# Patient Record
Sex: Male | Born: 1975 | Race: White | Hispanic: No | Marital: Married
Health system: Midwestern US, Academic
[De-identification: ages and names within clinical notes are randomized; demographics above are authoritative.]

---

## 2017-07-31 ENCOUNTER — Encounter: Admit: 2017-07-31 | Discharge: 2017-07-31 | Payer: Commercial Managed Care - HMO

## 2017-07-31 ENCOUNTER — Encounter: Admit: 2017-07-31 | Discharge: 2017-07-31 | Payer: BC Managed Care – PPO

## 2017-07-31 ENCOUNTER — Inpatient Hospital Stay: Admit: 2017-07-31 | Discharge: 2017-07-31 | Payer: BC Managed Care – PPO

## 2017-07-31 DIAGNOSIS — S020XXA Fracture of vault of skull, initial encounter for closed fracture: Principal | ICD-10-CM

## 2017-07-31 DIAGNOSIS — S0219XA Other fracture of base of skull, initial encounter for closed fracture: Principal | ICD-10-CM

## 2017-07-31 DIAGNOSIS — R69 Illness, unspecified: ICD-10-CM

## 2017-07-31 DIAGNOSIS — R569 Unspecified convulsions: ICD-10-CM

## 2017-07-31 DIAGNOSIS — R561 Post traumatic seizures: ICD-10-CM

## 2017-07-31 DIAGNOSIS — S060X1A Concussion with loss of consciousness of 30 minutes or less, initial encounter: Secondary | ICD-10-CM

## 2017-07-31 MED ORDER — POTASSIUM CHLORIDE IN WATER 10 MEQ/50 ML IV PGBK
10 meq | INTRAVENOUS | 0 refills | Status: CP
Start: 2017-07-31 — End: ?
  Administered 2017-08-01 (×5): 10 meq via INTRAVENOUS

## 2017-07-31 MED ORDER — FENTANYL CITRATE (PF) 50 MCG/ML IJ SOLN
25-50 ug | INTRAVENOUS | 0 refills | Status: DC | PRN
Start: 2017-07-31 — End: 2017-08-01

## 2017-07-31 MED ORDER — BENZOCAINE-MENTHOL 6-10 MG MM LOZG
1 | ORAL | 0 refills | Status: DC | PRN
Start: 2017-07-31 — End: 2017-08-02

## 2017-07-31 MED ORDER — LEVETIRACETAM IN NACL (ISO-OS) 1,500 MG/100 ML IV PGBK
1500 mg | Freq: Once | INTRAVENOUS | 0 refills | Status: CP
Start: 2017-07-31 — End: ?
  Administered 2017-08-01: 05:00:00 1500 mg via INTRAVENOUS

## 2017-07-31 MED ORDER — ONDANSETRON HCL (PF) 4 MG/2 ML IJ SOLN
4-8 mg | INTRAVENOUS | 0 refills | Status: DC | PRN
Start: 2017-07-31 — End: 2017-08-02

## 2017-08-01 ENCOUNTER — Encounter: Admit: 2017-08-01 | Discharge: 2017-08-01 | Payer: BC Managed Care – PPO

## 2017-08-01 ENCOUNTER — Inpatient Hospital Stay: Admit: 2017-08-01 | Discharge: 2017-08-01 | Payer: Commercial Managed Care - HMO

## 2017-08-01 ENCOUNTER — Inpatient Hospital Stay: Admit: 2017-08-01 | Discharge: 2017-08-01 | Payer: BC Managed Care – PPO

## 2017-08-01 ENCOUNTER — Inpatient Hospital Stay
Admit: 2017-08-01 | Discharge: 2017-08-01 | Disposition: A | Discharge status: Home / Self Care | Payer: BC Managed Care – PPO | Source: Other Acute Inpatient Hospital

## 2017-08-01 DIAGNOSIS — I10 Essential (primary) hypertension: ICD-10-CM

## 2017-08-01 DIAGNOSIS — G8911 Acute pain due to trauma: ICD-10-CM

## 2017-08-01 DIAGNOSIS — E871 Hypo-osmolality and hyponatremia: ICD-10-CM

## 2017-08-01 DIAGNOSIS — S0219XA Other fracture of base of skull, initial encounter for closed fracture: Principal | ICD-10-CM

## 2017-08-01 DIAGNOSIS — R569 Unspecified convulsions: ICD-10-CM

## 2017-08-01 DIAGNOSIS — S060X1A Concussion with loss of consciousness of 30 minutes or less, initial encounter: ICD-10-CM

## 2017-08-01 DIAGNOSIS — S020XXA Fracture of vault of skull, initial encounter for closed fracture: Principal | ICD-10-CM

## 2017-08-01 LAB — COMPREHENSIVE METABOLIC PANEL
Lab: 0.6 mg/dL (ref 0.4–1.24)
Lab: 0.9 mg/dL — ABNORMAL LOW (ref 0.3–1.2)
Lab: 132 MMOL/L — ABNORMAL LOW (ref 137–147)
Lab: 16 U/L — ABNORMAL LOW (ref 7–56)
Lab: 29 U/L (ref 7–40)
Lab: 3.4 MMOL/L — ABNORMAL LOW (ref 3.5–5.1)
Lab: 30 MMOL/L — ABNORMAL HIGH (ref 21–30)
Lab: 4.5 g/dL (ref 3.5–5.0)
Lab: 6.8 g/dL — ABNORMAL HIGH (ref 6.0–8.0)
Lab: 7 mg/dL (ref 7–25)
Lab: 77 U/L (ref 25–110)
Lab: 9 10*3/uL (ref 3–12)
Lab: 9.3 mg/dL (ref 8.5–10.6)
Lab: 93 MMOL/L — ABNORMAL LOW (ref 98–110)
Lab: 96 mg/dL (ref 70–100)
Lab: >60 mL/min (ref >60)
Lab: >60 mL/min (ref >60)
Lab: 131 MMOL/L — ABNORMAL LOW (ref 137–147)

## 2017-08-01 LAB — PROTIME INR (PT): Lab: 0.9 g/dL (ref 0.8–1.2)

## 2017-08-01 LAB — PTT (APTT): Lab: 22 s — ABNORMAL LOW (ref 24.0–36.5)

## 2017-08-01 LAB — CBC AND DIFF
Lab: 10 10*3/uL (ref 4.5–11.0)
Lab: 9.6 K/UL — ABNORMAL LOW (ref 4.5–11.0)

## 2017-08-01 MED ORDER — LEVETIRACETAM 500 MG PO TAB
500 mg | Freq: Two times a day (BID) | ORAL | 0 refills | Status: DC
Start: 2017-08-01 — End: 2017-08-02

## 2017-08-01 MED ORDER — POLYETHYLENE GLYCOL 3350 17 GRAM PO PWPK
1 | Freq: Every day | ORAL | 0 refills | Status: DC
Start: 2017-08-01 — End: 2017-08-02

## 2017-08-01 MED ORDER — OXYCODONE 5 MG PO TAB
5-10 mg | ORAL_TABLET | ORAL | 0 refills | 6.00000 days | Status: AC | PRN
Start: 2017-08-01 — End: 2019-08-02

## 2017-08-01 MED ORDER — GADOBENATE DIMEGLUMINE 529 MG/ML (0.1MMOL/0.2ML) IV SOLN
13 mL | Freq: Once | INTRAVENOUS | 0 refills | Status: CP
Start: 2017-08-01 — End: ?
  Administered 2017-08-01: 09:00:00 13 mL via INTRAVENOUS

## 2017-08-01 MED ORDER — ACETAMINOPHEN 325 MG PO TAB
650 mg | ORAL | 0 refills | Status: AC
Start: 2017-08-01 — End: 2019-08-02

## 2017-08-01 MED ORDER — LEVETIRACETAM 500 MG PO TAB
500 mg | ORAL_TABLET | Freq: Two times a day (BID) | ORAL | 0 refills | 90.00000 days | Status: AC
Start: 2017-08-01 — End: ?

## 2017-08-01 MED ORDER — ACETAMINOPHEN 325 MG PO TAB
650 mg | ORAL | 0 refills | Status: DC
Start: 2017-08-01 — End: 2017-08-02
  Administered 2017-08-01: 15:00:00 650 mg via ORAL

## 2017-08-01 MED ORDER — ENOXAPARIN 30 MG/0.3 ML SC SYRG
30 mg | Freq: Two times a day (BID) | SUBCUTANEOUS | 0 refills | Status: DC
Start: 2017-08-01 — End: 2017-08-02
  Administered 2017-08-01: 15:00:00 30 mg via SUBCUTANEOUS

## 2017-08-01 MED ORDER — SENNOSIDES 8.6 MG PO TAB
1 | Freq: Two times a day (BID) | ORAL | 0 refills | Status: DC
Start: 2017-08-01 — End: 2017-08-02

## 2017-08-01 MED ORDER — SENNOSIDES 8.6 MG PO TAB
1 | ORAL_TABLET | Freq: Two times a day (BID) | ORAL | 3 refills | Status: AC
Start: 2017-08-01 — End: 2019-08-02

## 2017-08-01 MED ORDER — IOHEXOL 350 MG IODINE/ML IV SOLN
80 mL | Freq: Once | INTRAVENOUS | 0 refills | Status: CP
Start: 2017-08-01 — End: ?
  Administered 2017-08-01: 06:00:00 80 mL via INTRAVENOUS

## 2017-08-01 MED ORDER — SODIUM CHLORIDE 0.9 % IJ SOLN
50 mL | Freq: Once | INTRAVENOUS | 0 refills | Status: CP
Start: 2017-08-01 — End: ?
  Administered 2017-08-01: 06:00:00 50 mL via INTRAVENOUS

## 2017-08-01 MED ORDER — OXYCODONE 5 MG PO TAB
5-10 mg | ORAL | 0 refills | Status: DC | PRN
Start: 2017-08-01 — End: 2017-08-02

## 2017-08-01 MED ORDER — POLYETHYLENE GLYCOL 3350 17 GRAM PO PWPK
17 g | Freq: Every day | ORAL | 0 refills | 18.00000 days | Status: AC
Start: 2017-08-01 — End: 2019-08-02

## 2017-08-03 ENCOUNTER — Encounter: Admit: 2017-08-03 | Discharge: 2017-08-03 | Payer: BC Managed Care – PPO

## 2017-08-17 ENCOUNTER — Ambulatory Visit: Admit: 2017-08-17 | Discharge: 2017-08-18 | Payer: BC Managed Care – PPO

## 2017-08-17 ENCOUNTER — Encounter: Admit: 2017-08-17 | Discharge: 2017-08-17 | Payer: Commercial Managed Care - HMO

## 2017-08-17 DIAGNOSIS — F1010 Alcohol abuse, uncomplicated: ICD-10-CM

## 2017-08-17 DIAGNOSIS — S069X9D Unspecified intracranial injury with loss of consciousness of unspecified duration, subsequent encounter: ICD-10-CM

## 2017-08-17 DIAGNOSIS — R569 Unspecified convulsions: ICD-10-CM

## 2017-08-17 DIAGNOSIS — S020XXD Fracture of vault of skull, subsequent encounter for fracture with routine healing: ICD-10-CM

## 2017-08-17 DIAGNOSIS — W19XXXD Unspecified fall, subsequent encounter: Principal | ICD-10-CM

## 2017-08-17 DIAGNOSIS — S020XXA Fracture of vault of skull, initial encounter for closed fracture: Principal | ICD-10-CM

## 2017-08-17 DIAGNOSIS — R41840 Attention and concentration deficit: ICD-10-CM

## 2017-08-20 ENCOUNTER — Encounter: Admit: 2017-08-20 | Discharge: 2017-08-20 | Payer: Commercial Managed Care - HMO

## 2017-08-20 DIAGNOSIS — R569 Unspecified convulsions: Principal | ICD-10-CM

## 2017-08-20 DIAGNOSIS — S0219XA Other fracture of base of skull, initial encounter for closed fracture: ICD-10-CM

## 2017-11-29 ENCOUNTER — Encounter: Admit: 2017-11-29 | Discharge: 2017-11-29 | Payer: BC Managed Care – PPO

## 2018-01-10 ENCOUNTER — Encounter: Admit: 2018-01-10 | Discharge: 2018-01-10 | Payer: BC Managed Care – PPO

## 2018-05-15 ENCOUNTER — Encounter: Admit: 2018-05-15 | Discharge: 2018-05-15 | Payer: BC Managed Care – PPO

## 2018-12-03 ENCOUNTER — Encounter: Admit: 2018-12-03 | Discharge: 2018-12-03

## 2018-12-03 NOTE — Telephone Encounter
Attempted to call patient for previsit planning. lvm requesting return call    Initial Visit Date: 12/05/18     Reason for Visit:seizures     Any recent injuries?    Previous Diagnosis of Epilepsy?     If Yes? Then by who and at what age?      Physician Information  PCP:   Referring Physician: Helayne Seminole, MD  Previous Neurologist:    Other providers seen:      Previous Imaging/Procedures: CT head     Recent ED/Hospitalizations:      Current Meds:     Current Allergies:     Past Tried and Failed AED:    Reason for Discontinuing any AED's:     Epilepsy Education Provided: (List the education that we provide over the phone)   Any social concerns or social work concerns that need to be addressed at appointment with physician

## 2018-12-05 ENCOUNTER — Encounter: Admit: 2018-12-05 | Discharge: 2018-12-05

## 2018-12-05 ENCOUNTER — Ambulatory Visit: Admit: 2018-12-05 | Discharge: 2018-12-06

## 2018-12-05 ENCOUNTER — Ambulatory Visit: Admit: 2018-12-05 | Discharge: 2018-12-05

## 2018-12-05 DIAGNOSIS — G40109 Localization-related (focal) (partial) symptomatic epilepsy and epileptic syndromes with simple partial seizures, not intractable, without status epilepticus: Secondary | ICD-10-CM

## 2018-12-05 DIAGNOSIS — R569 Unspecified convulsions: Secondary | ICD-10-CM

## 2018-12-05 DIAGNOSIS — S0219XA Other fracture of base of skull, initial encounter for closed fracture: Secondary | ICD-10-CM

## 2018-12-05 MED ORDER — ZONISAMIDE 100 MG PO CAP
ORAL_CAPSULE | Freq: Every evening | ORAL | 5 refills | 90.00000 days | Status: DC
Start: 2018-12-05 — End: 2019-04-22

## 2018-12-05 NOTE — Progress Notes
Patient was scheduled for Video EEG Sunday, September 20th at 11 AM.   MRI 3T scheduled for 9/1    VEEG phamphlet provided. All questions answered.
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
## 2018-12-06 ENCOUNTER — Encounter: Admit: 2018-12-06 | Discharge: 2018-12-06

## 2018-12-06 DIAGNOSIS — R569 Unspecified convulsions: Secondary | ICD-10-CM

## 2018-12-06 DIAGNOSIS — S0219XA Other fracture of base of skull, initial encounter for closed fracture: Secondary | ICD-10-CM

## 2018-12-17 ENCOUNTER — Encounter: Admit: 2018-12-17 | Discharge: 2018-12-17

## 2018-12-17 NOTE — Telephone Encounter
No improvement would not be surprising as it is such a low dose.     However, the mood issue is important. What is going on in that regard; how has depression worsened? Any suicidal thoughts or other negative intrusive thoughts?     If the mood issue is significant, then we would just stop the medication now. We would then consider another medicine such as oxcarbazepine (after zonisamide out of his system).     If the mood issue is uncertain or mild, etc, we could see how he does on the 200. But, with close monitoring of mood and contacting us if anything worsens.     Melburn Popper, MD

## 2018-12-17 NOTE — Telephone Encounter
Received a call from the patient's wife with update. Wife states that patient's events have not improved in frequency at all since starting zonisamide. Pt currently taking 100 mg qhs, scheduled to increase to 200 mg qhs today. Wife also states that pt's depression has worsened since starting this medication.

## 2018-12-17 NOTE — Telephone Encounter
Spoke with patient/patient's wife regarding medication. Went over recommendations per Dr. Ladell Pier. Wife states that pt is depressed (feels down/negative) but they are comfortable with monitoring for now. Pt will increase to 200 mg and closely watch for any worsening of depression. Wife states she will call with any concerns and is comfortable with the plan moving forward. She states that pt is not having suicidal thoughts.

## 2019-01-07 ENCOUNTER — Ambulatory Visit: Admit: 2019-01-07 | Discharge: 2019-01-07 | Payer: Commercial Managed Care - HMO

## 2019-01-07 DIAGNOSIS — G40109 Localization-related (focal) (partial) symptomatic epilepsy and epileptic syndromes with simple partial seizures, not intractable, without status epilepticus: Secondary | ICD-10-CM
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
## 2019-01-22 ENCOUNTER — Ambulatory Visit: Admit: 2019-01-22 | Discharge: 2019-01-22

## 2019-01-22 ENCOUNTER — Encounter: Admit: 2019-01-22 | Discharge: 2019-01-22

## 2019-01-22 DIAGNOSIS — G40109 Localization-related (focal) (partial) symptomatic epilepsy and epileptic syndromes with simple partial seizures, not intractable, without status epilepticus: Principal | ICD-10-CM

## 2019-01-22 MED ORDER — GADOBENATE DIMEGLUMINE 529 MG/ML (0.1MMOL/0.2ML) IV SOLN
14 mL | Freq: Once | INTRAVENOUS | 0 refills | Status: CP
Start: 2019-01-22 — End: ?
  Administered 2019-01-22: 19:00:00 14 mL via INTRAVENOUS

## 2019-01-25 ENCOUNTER — Encounter: Admit: 2019-01-25 | Discharge: 2019-01-25

## 2019-01-25 NOTE — Telephone Encounter
-----   Message from Melburn Popper, MD sent at 01/23/2019 12:17 PM CDT -----  Alli, please let pt know that MRI brain was stable from his prior one in 2019.     I also ccStanton Kidney in case you are not able to get to this.     Thanks   Melburn Popper, MD

## 2019-01-25 NOTE — Telephone Encounter
Spoke with pt wife and discussed results of MRI per Dr. Ladell Pier note. Wife verbalzied understanding

## 2019-01-30 NOTE — Telephone Encounter
Wife called and wants to know where they go from here. I will be glad to call her back if you know what Dr Leslie Andrea plan is. Thanks.

## 2019-02-04 ENCOUNTER — Encounter: Admit: 2019-02-04 | Discharge: 2019-02-04 | Payer: Commercial Managed Care - HMO

## 2019-02-04 NOTE — Telephone Encounter
LVM requesting return call to confirm VEEG admission. Admission approved by insurance

## 2019-02-16 IMAGING — CT Spine^1_C_SPINE (Adult)
1 series · 12 of 14 positions shown, 15 images · non-contrast
Comparison: none

[Series 3: c-spine 1.5 soft tissue · axial · 0.34mm/px · z∈[+81,+232]mm · 12 of 121 slices shown, 15 images]
[im 10/121  soft-tissue]
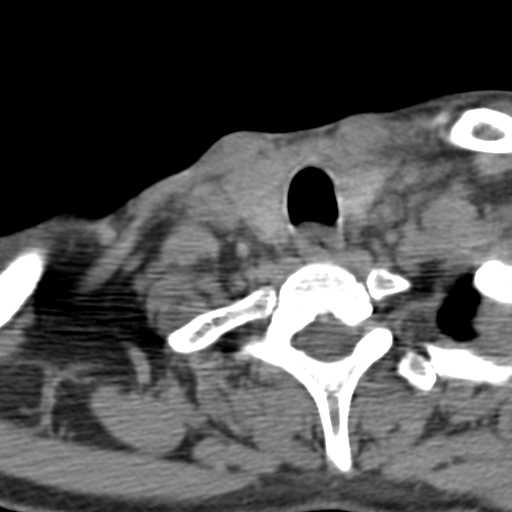
[im 10/121  bone]
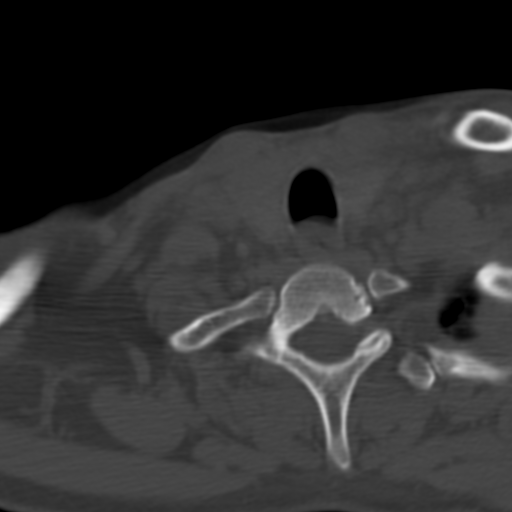
[im 19/121  bone]
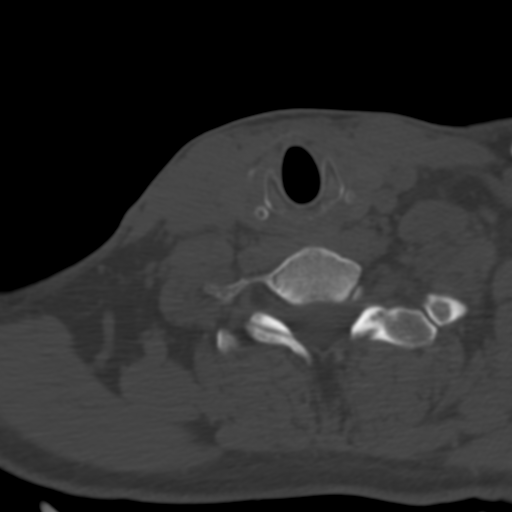
[im 28/121  bone]
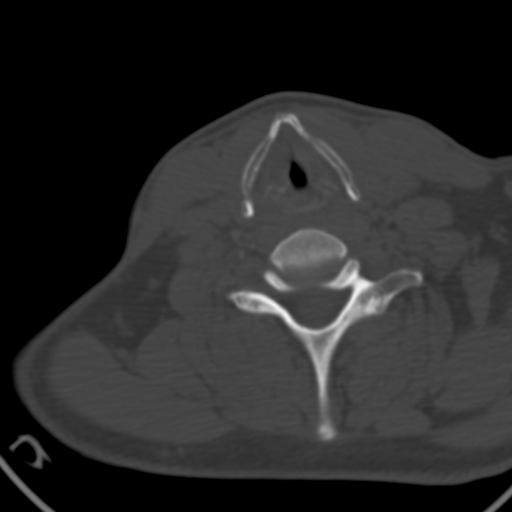
[im 37/121  bone]
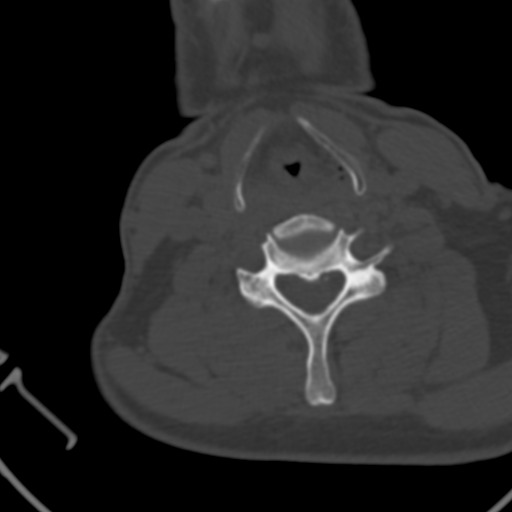
[im 47/121  soft-tissue]
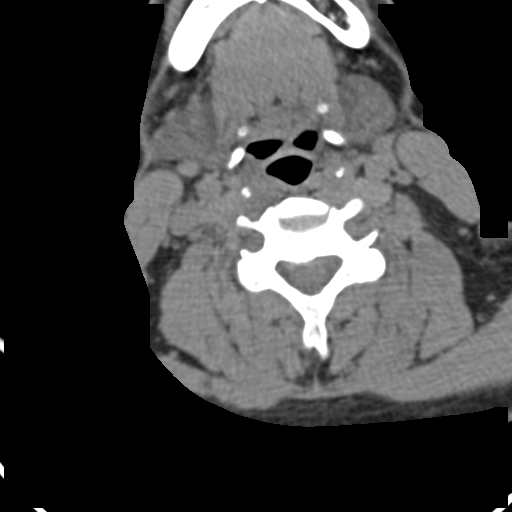
[im 47/121  bone]
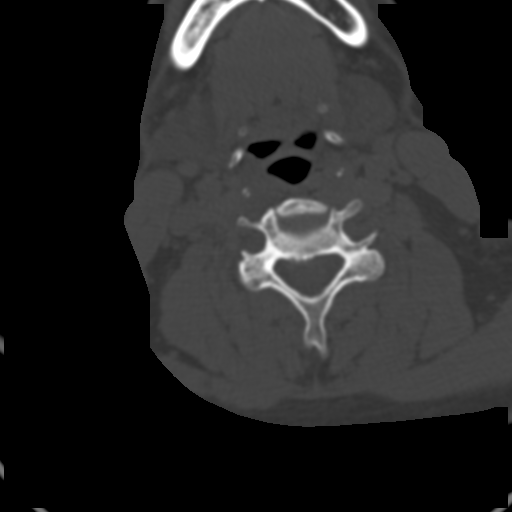
[im 56/121  bone]
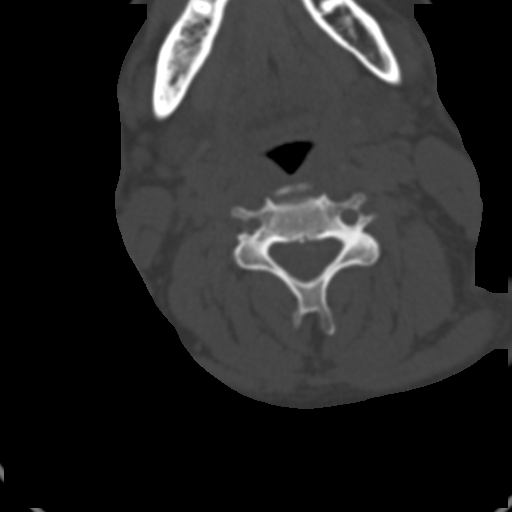
[im 65/121  bone]
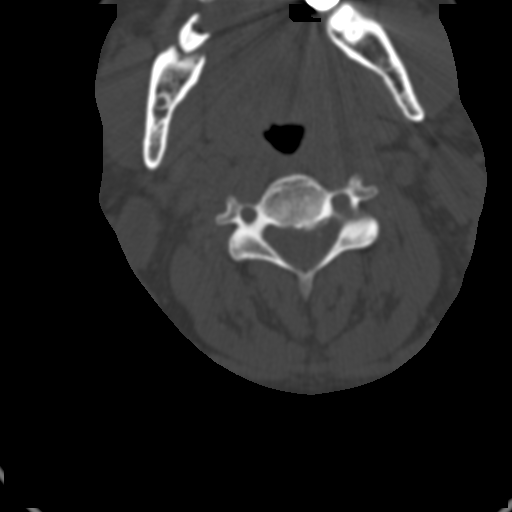
[im 74/121  bone]
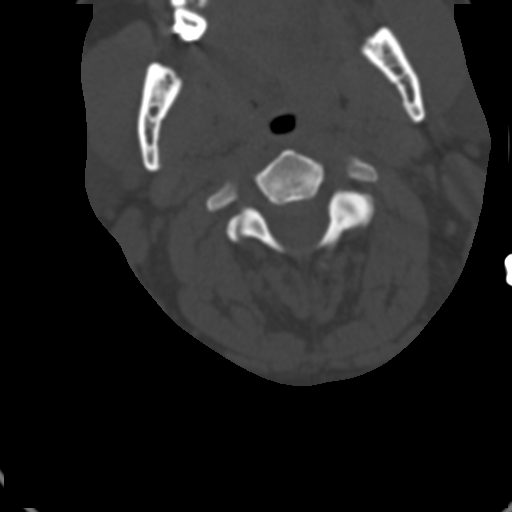
[im 84/121  soft-tissue]
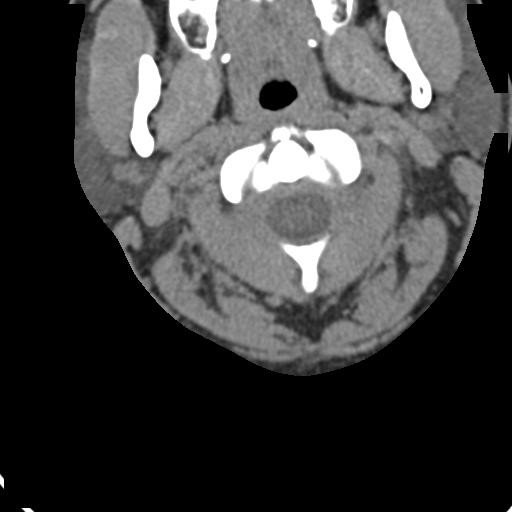
[im 84/121  bone]
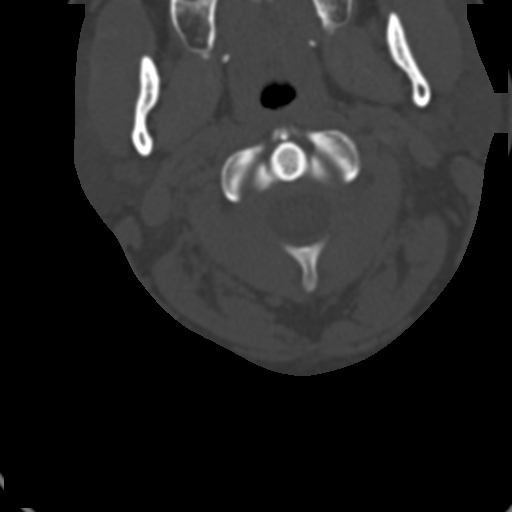
[im 93/121  bone]
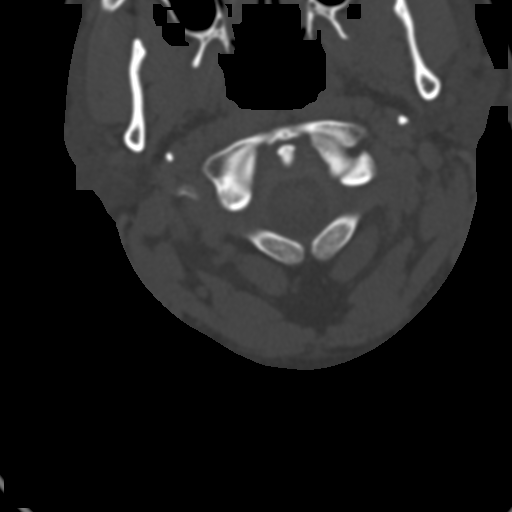
[im 102/121  bone]
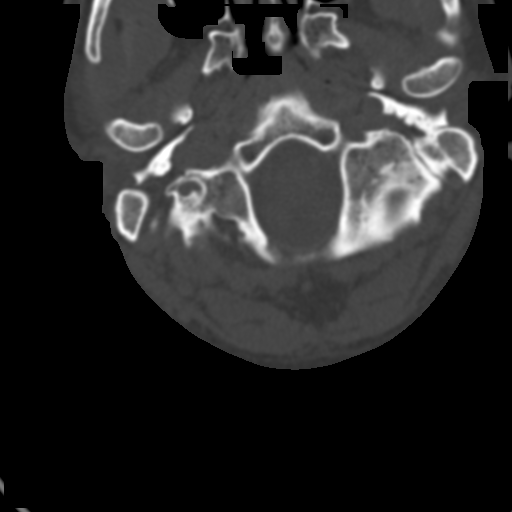
[im 111/121  bone]
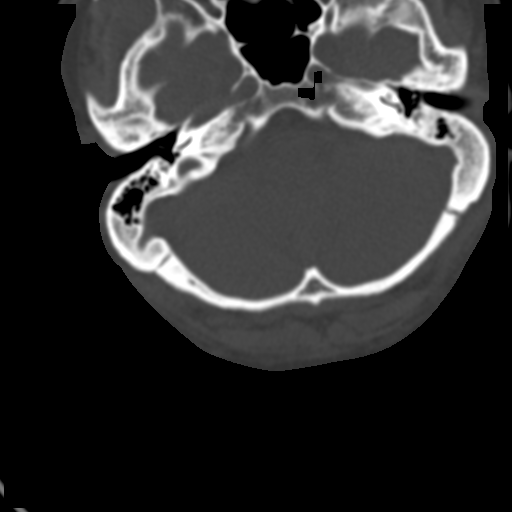

[12 of 14 positions shown; findings below may reference images not displayed]

EXAM

CT cervical spine without contrast

INDICATION

injury
NEW ONSET SEIZURE. RIGHT SIDE TEMPORAL TRAUMA. TJ

TECHNIQUE

Volumetric multi detector CT images of the cervical spine were obtained without the administration
of IV contrast.

All CT scans at this facility use dose modulation, iterative reconstruction, and/or weight based
dosing when appropriate to reduce radiation dose to as low as reasonably achievable.

COMPARISONS

None available.

FINDINGS

The cervical vertebral body heights are grossly maintained with mild anterolisthesis of C2 on C3.
There is moderate multilevel degenerative disc disease with marginal osteophyte formation and
spinal canal narrowing. The facets are grossly well aligned. There is no significant
spondylolisthesis. There is no large, displaced fracture. Midline posterior fusion anomaly of the
C1 arch is appreciated. The lung apices are grossly clear. The odontoid process is grossly intact.

IMPRESSION

1.  Mild to moderate degenerative changes of the cervical spine without acute osseous abnormality.

## 2019-02-16 IMAGING — CT Head^_WITHOUT_CONTRAST (Adult)
1 series · 15 of 30 positions shown, 19 images · non-contrast
Comparison: none

[Series 2: brain w/o 4.8 brain · axial · non-contrast · 0.55mm/px · z∈[+143,+302]mm · 15 of 36 slices shown, 19 images]
[im 2/36  brain]
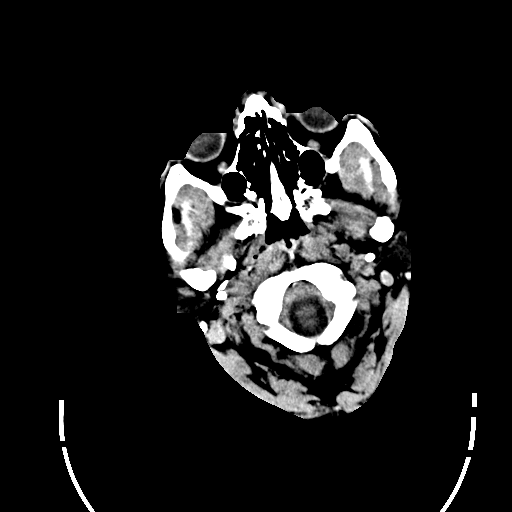
[im 2/36  bone]
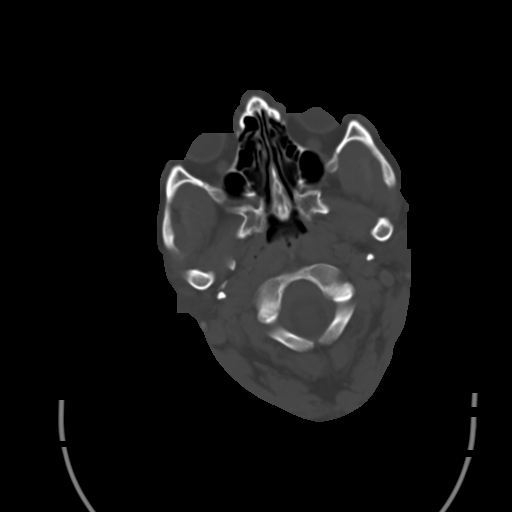
[im 4/36  brain]
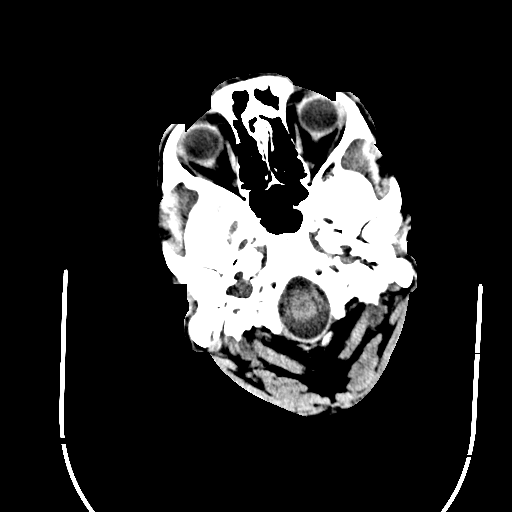
[im 7/36  brain]
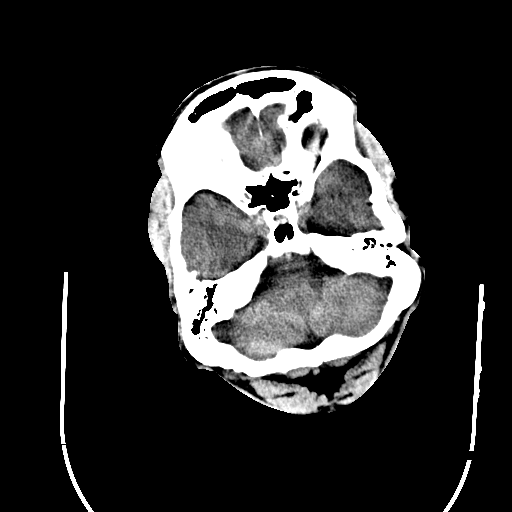
[im 9/36  brain]
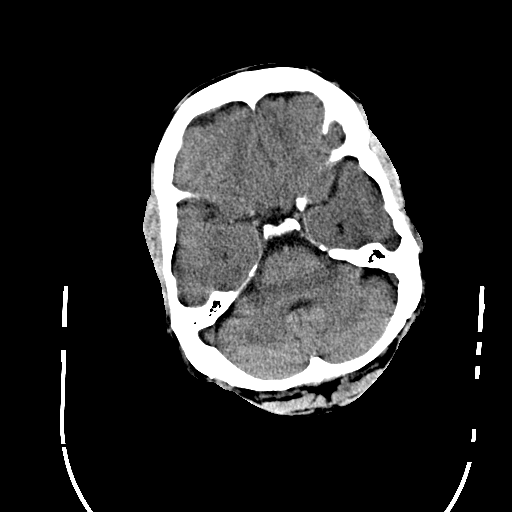
[im 11/36  brain]
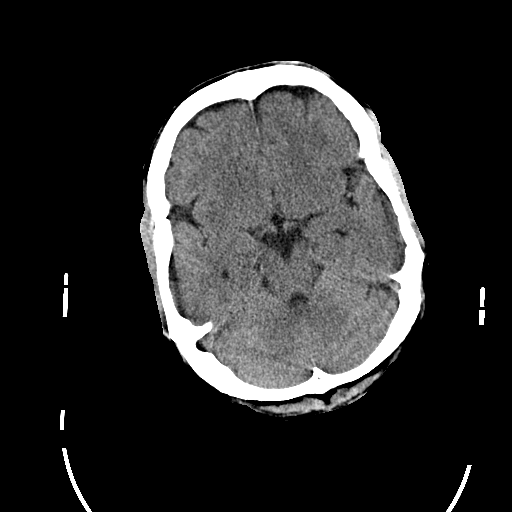
[im 11/36  bone]
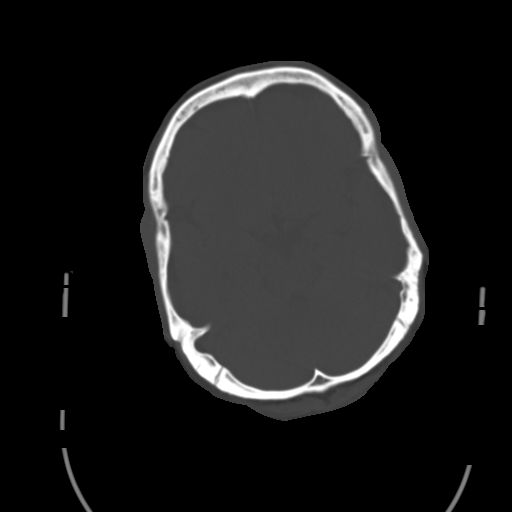
[im 14/36  brain]
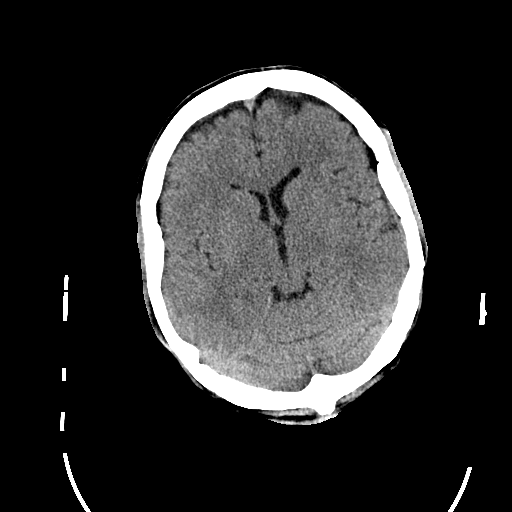
[im 16/36  brain]
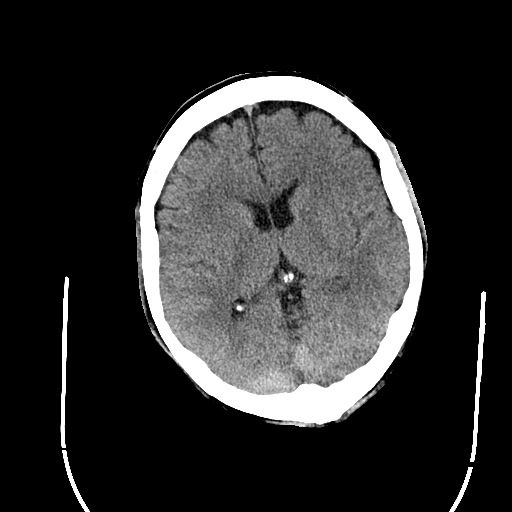
[im 19/36  brain]
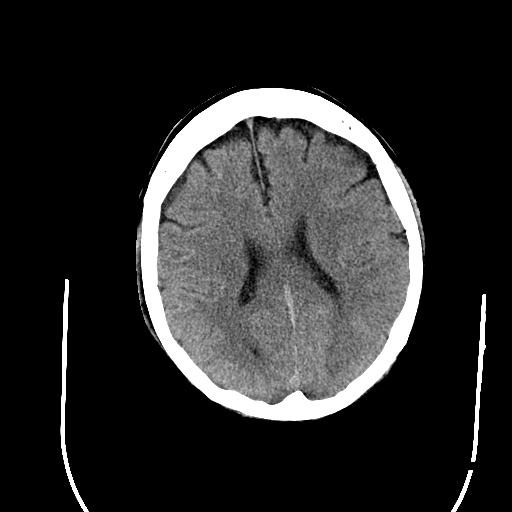
[im 20/36  brain]
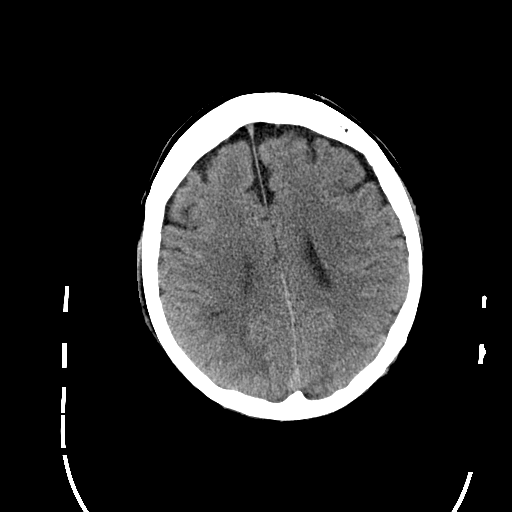
[im 20/36  bone]
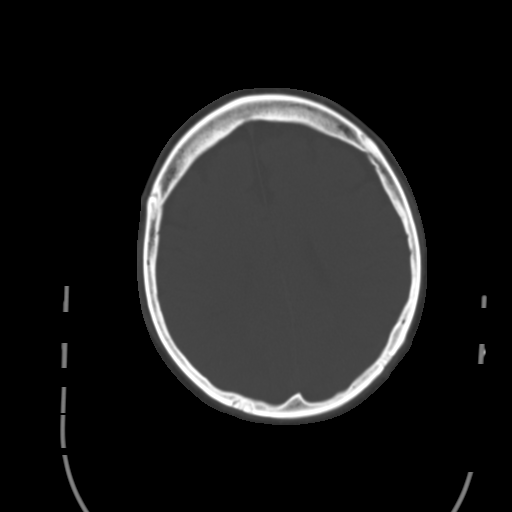
[im 22/36  brain]
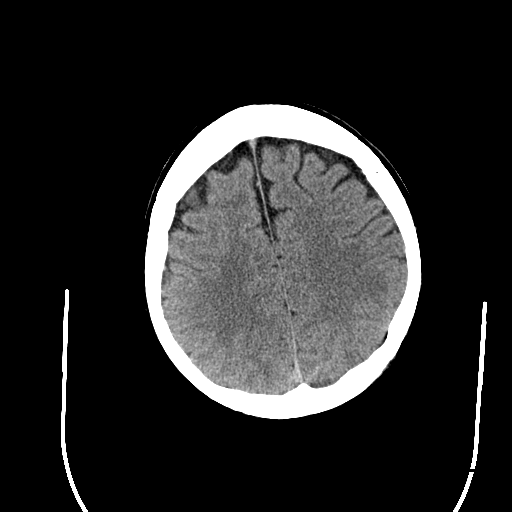
[im 25/36  brain]
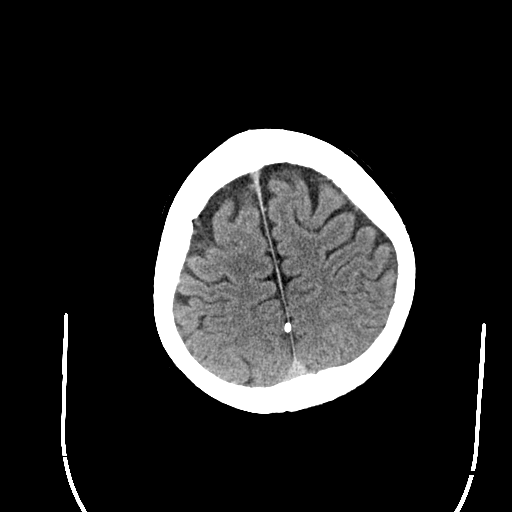
[im 27/36  brain]
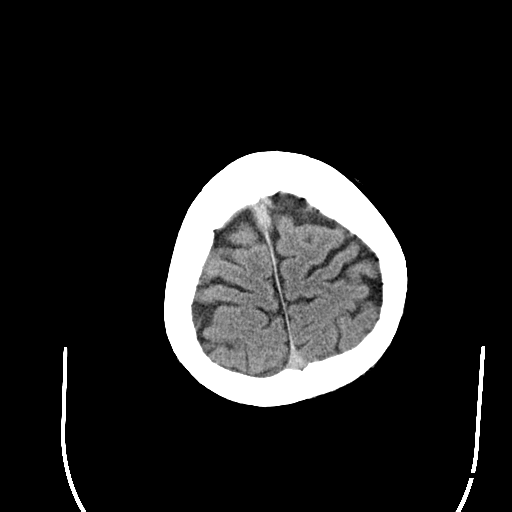
[im 29/36  brain]
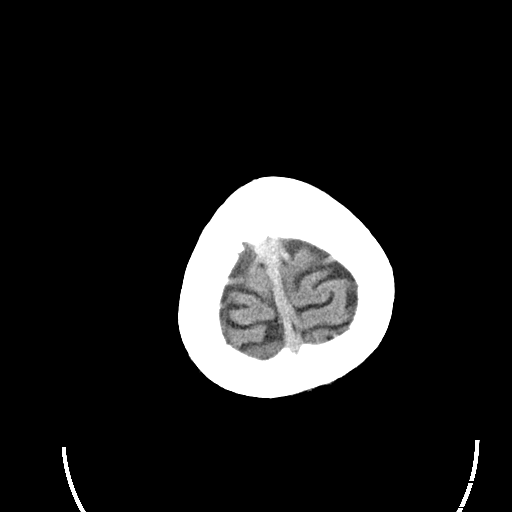
[im 29/36  bone]
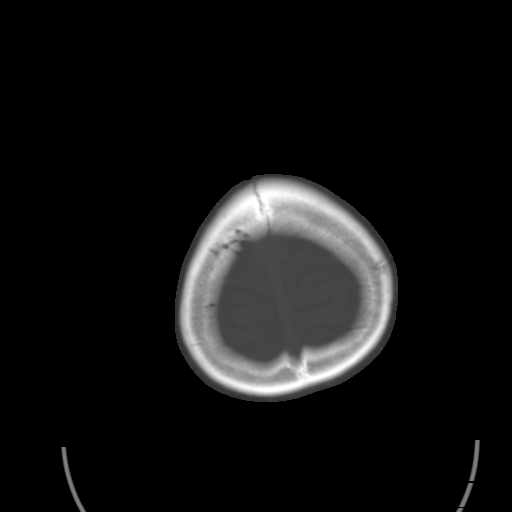
[im 32/36  brain]
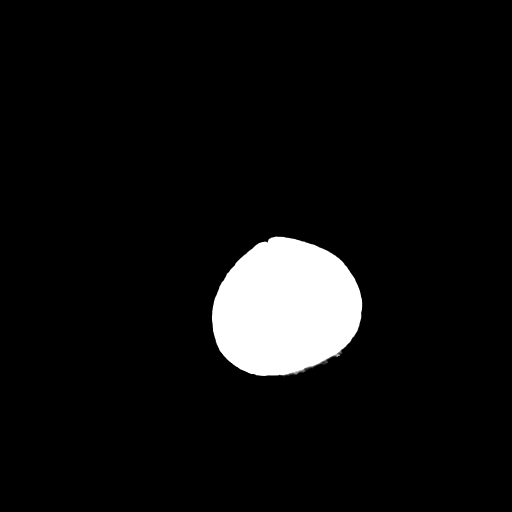
[im 34/36  brain]
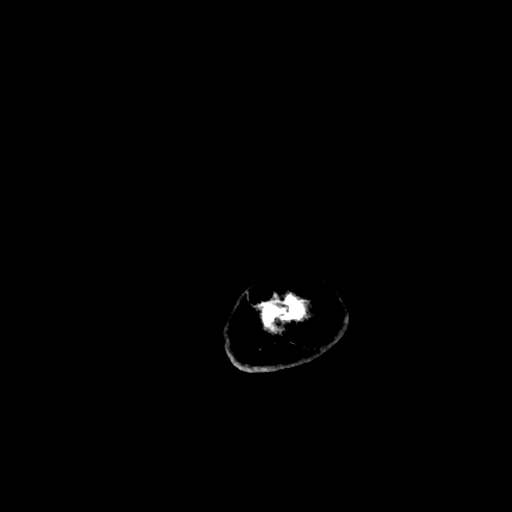

[15 of 30 positions shown; findings below may reference images not displayed]

DIAGNOSTIC STUDIES

EXAM

CT HEAD WITHOUT CONTRAST

INDICATION

grand mall seizure (1st time) and R temporal trauma
NEW ONSET SEIZURE ACTIVITY.

TECHNIQUE

Contiguous transaxial imaging from the vertex of the scalp throughout the skull base was performed
in the absence of intravenous iodinated contrast material.

All CT scans at this facility use dose modulation, iterative reconstruction, and/or weight based
dosing when appropriate to reduce radiation dose to as low as reasonably achievable.

COMPARISONS

No comparison is available

FINDINGS

The ventricles and subarachnoid spaces are within normal limits. The basal cisterns are clear.
There is no midline shift or mass effect. No acute intracranial hemorrhage or extraaxial fluid
collection is identified. The gray-white matter interface is grossly preserved.

The visualized globes and orbits are unremarkable. No scalp mass is identified. The visualized
paranasal sinuses and mastoid air cells are clear. There is a nondisplaced hairline fracture of the
greater wing of the right sphenoid that extends up to the right foraminal volume without
displacement. There may also be a subtle nondisplaced fracture of the far lateral aspect of the
posterior wall of the mandibular fossa.

IMPRESSION

Nondisplaced fracture greater wing right sphenoid up to foraminal [REDACTED] without acute
intracranial hemorrhage or significant mass effect. If further imaging evaluation is clinically
warranted, consider postcontrast MRI.

## 2019-04-09 ENCOUNTER — Inpatient Hospital Stay: Admit: 2019-04-09 | Discharge: 2019-04-09 | Payer: Commercial Managed Care - HMO

## 2019-04-09 DIAGNOSIS — G40109 Localization-related (focal) (partial) symptomatic epilepsy and epileptic syndromes with simple partial seizures, not intractable, without status epilepticus: Secondary | ICD-10-CM

## 2019-04-10 ENCOUNTER — Encounter: Admit: 2019-04-10 | Discharge: 2019-04-10 | Payer: Commercial Managed Care - HMO

## 2019-04-10 NOTE — Telephone Encounter
LVM requesting return call

## 2019-04-19 ENCOUNTER — Encounter: Admit: 2019-04-19 | Discharge: 2019-04-19 | Payer: Commercial Managed Care - HMO

## 2019-04-22 MED ORDER — ZONISAMIDE 100 MG PO CAP
ORAL_CAPSULE | Freq: Every evening | ORAL | 3 refills | 90.00000 days | Status: DC
Start: 2019-04-22 — End: 2019-08-02

## 2019-07-17 ENCOUNTER — Encounter: Admit: 2019-07-17 | Discharge: 2019-07-17 | Payer: Commercial Managed Care - HMO

## 2019-08-01 ENCOUNTER — Encounter: Admit: 2019-08-01 | Discharge: 2019-08-01 | Payer: Commercial Managed Care - HMO

## 2019-08-01 ENCOUNTER — Inpatient Hospital Stay: Admit: 2019-08-01 | Payer: Commercial Managed Care - HMO

## 2019-08-01 LAB — COMPREHENSIVE METABOLIC PANEL
Lab: 0.4 mg/dL (ref 0.3–1.2)
Lab: 0.6 mg/dL (ref 0.4–1.24)
Lab: 10 (ref 3–12)
Lab: 100 MMOL/L (ref 98–110)
Lab: 136 MMOL/L — ABNORMAL LOW (ref 137–147)
Lab: 26 MMOL/L (ref 21–30)
Lab: 3.9 MMOL/L (ref 3.5–5.1)
Lab: 4.3 g/dL (ref 3.5–5.0)
Lab: 6.9 g/dL (ref 6.0–8.0)
Lab: 61 U/L — ABNORMAL HIGH (ref 7–40)
Lab: 75 mg/dL (ref 70–100)
Lab: 79 U/L (ref 25–110)
Lab: 8 mg/dL (ref 7–25)
Lab: 9.4 mg/dL (ref 8.5–10.6)
Lab: >60 mL/min (ref >60)
Lab: >60 mL/min (ref >60)

## 2019-08-01 LAB — CBC: Lab: 7 10*3/uL (ref 4.5–11.0)

## 2019-08-01 MED ORDER — LEVETIRACETAM 500 MG PO TAB
1000 mg | Freq: Two times a day (BID) | ORAL | 0 refills | Status: DC
Start: 2019-08-01 — End: 2019-08-02
  Administered 2019-08-02 (×2): 1000 mg via ORAL

## 2019-08-01 NOTE — Progress Notes
Patient arrived to room # [id number] via ambulation accompanied by family. Bedside safety checks completed. Initial patient assessment completed. Refer to flowsheet for details.    Admission skin assessment completed with: S. Mayse, RN    Pressure injury present on arrival?: No    1. Head/Face/Neck: No  2. Trunk/Back: No  3. Upper Extremities: No  4. Lower Extremities: No  5. Pelvic/Coccyx: No  6. Assessed for device associated injury? Yes  7. Malnutrition Screening Tool (Nursing Nutrition Assessment) Completed? Yes    See Doc Flowsheet for additional wound details.     INTERVENTIONS:

## 2019-08-01 NOTE — Progress Notes
EEG electrodes were successfully placed on pt's scalp and secured without complications. V-EEG was instructed to both the pt and their family. All questions were answered and addressed to their liking. Seizure pads are placed on the bed rail. The event button and emergent call light are placed within the pt and their family's reach. Video, sound and EEG tracings are all functioning properly.

## 2019-08-01 NOTE — Care Plan
[facility] CONSULTATION    ASSESSMENT/RECOMMENDATIONS  Patient was referred for [facility] consultation  Tobacco Use Treatment Practical Counseling was provided in hospital (including recognizing danger situations, developing coping skills and providing basic information about quitting).         This tobacco treatment counseling and education consult was completed by phone due to COVID 19 social isolation protocols.     MEDICATION RECOMMENDATIONS TO QUIT TOBACCO:  In-patient quit-tobacco medication:  Patient declined using smoking cessation medication at this time. Pt reports no cravings. If cravings should begin, recommended dose of nicotine replacement is 21 mg nicotine patch, along with 4 mg nicotine gum or lozenges.   Discharge medication options:  Patient declined using smoking cessation medication at this time     Post discharge support referral: Declined    [facility] Educational Material: Accepted  Materials will be emailed        History of Present Illness   Reports using 20 cigarettes per day.  Reports using tobacco within 6-30 minutes minutes of waking.  E-Cigarette or vape use: Never  Other tobacco use: None   Years used: 64 Smoked from age [age], quit until age 45 and started smoking again  Withdrawal: No nicotine withdrawal based upon the patients rating on the Nicotine Withdrawal Behavior Rating Scale.   Patient lives with other smokers or vapers  Plan about smoking after patient leaves the hospital: Patient states he and his wife have discussed trying to quit smoking after he leaves the hospital  Interest in quitting: Moderate   Set a quit date: No    Contact Information    If I can be of further assistance, please call [facility] [phone]

## 2019-08-02 MED ORDER — LEVETIRACETAM 250 MG PO TAB
250 mg | Freq: Two times a day (BID) | ORAL | 0 refills | Status: DC
Start: 2019-08-02 — End: 2019-08-03
  Administered 2019-08-03 (×2): 250 mg via ORAL

## 2019-08-02 NOTE — Progress Notes
EEG signals are clean of artifact and visible.  Video was visualized and recording.  Audio recording was checked and functioning.  The VEEG system is on-line.  Central monitoring station is functioning appropriately.        The event button is operational and within reach of the patient and/or patient's family.

## 2019-08-03 MED ORDER — NICOTINE 7 MG/24 HR TD PT24
1 | Freq: Every day | TRANSDERMAL | 0 refills | Status: DC
Start: 2019-08-03 — End: 2019-08-07
  Administered 2019-08-04 – 2019-08-06 (×3): 1 via TRANSDERMAL

## 2019-08-06 MED ORDER — LEVETIRACETAM 500 MG PO TAB
1000 mg | Freq: Two times a day (BID) | ORAL | 0 refills | Status: DC
Start: 2019-08-06 — End: 2019-08-07
  Administered 2019-08-06 – 2019-08-07 (×3): 1000 mg via ORAL

## 2019-08-06 MED ORDER — LAMOTRIGINE 25 MG PO TAB
25 mg | Freq: Every day | ORAL | 0 refills | Status: DC
Start: 2019-08-06 — End: 2019-08-07
  Administered 2019-08-06 – 2019-08-07 (×2): 25 mg via ORAL

## 2019-08-07 MED ORDER — NICOTINE 7 MG/24 HR TD PT24
1 | MEDICATED_PATCH | Freq: Every day | TRANSDERMAL | 0 refills | Status: AC
Start: 2019-08-07 — End: ?

## 2019-08-07 MED ORDER — LAMOTRIGINE 25 MG PO TAB
ORAL_TABLET | Freq: Every day | 1 refills | Status: AC
Start: 2019-08-07 — End: ?

## 2019-08-07 MED ORDER — LEVETIRACETAM 1,000 MG PO TAB
1000 mg | ORAL_TABLET | Freq: Two times a day (BID) | ORAL | 1 refills | 90.00000 days | Status: AC
Start: 2019-08-07 — End: ?

## 2019-11-07 ENCOUNTER — Encounter: Admit: 2019-11-07 | Discharge: 2019-11-07 | Payer: Commercial Managed Care - HMO

## 2020-01-29 ENCOUNTER — Encounter: Admit: 2020-01-29 | Discharge: 2020-01-29 | Payer: Commercial Managed Care - HMO

## 2020-02-04 ENCOUNTER — Encounter: Admit: 2020-02-04 | Discharge: 2020-02-04 | Payer: Commercial Managed Care - HMO

## 2020-02-04 MED ORDER — LAMOTRIGINE 25 MG PO TAB
ORAL_TABLET | Freq: Every day | 1 refills
Start: 2020-02-04 — End: ?
